# Patient Record
Sex: Female | Born: 1995 | Race: White | Hispanic: No | Marital: Single | Smoking: Never smoker
Health system: Southern US, Community
[De-identification: ages and names within clinical notes are randomized; demographics above are authoritative.]

## PROBLEM LIST (undated history)

## (undated) DIAGNOSIS — M419 Scoliosis, unspecified: Secondary | ICD-10-CM

---

## 2015-03-14 ENCOUNTER — Encounter: Payer: Self-pay | Admitting: Emergency Medicine

## 2015-03-14 ENCOUNTER — Emergency Department: Payer: 59

## 2015-03-14 ENCOUNTER — Emergency Department
Admission: EM | Admit: 2015-03-14 | Discharge: 2015-03-14 | Disposition: A | Discharge status: Home / Self Care | Payer: 59 | Attending: Emergency Medicine | Admitting: Emergency Medicine

## 2015-03-14 DIAGNOSIS — M5412 Radiculopathy, cervical region: Secondary | ICD-10-CM | POA: Insufficient documentation

## 2015-03-14 DIAGNOSIS — S161XXA Strain of muscle, fascia and tendon at neck level, initial encounter: Secondary | ICD-10-CM | POA: Diagnosis not present

## 2015-03-14 DIAGNOSIS — Y998 Other external cause status: Secondary | ICD-10-CM | POA: Diagnosis not present

## 2015-03-14 DIAGNOSIS — X58XXXA Exposure to other specified factors, initial encounter: Secondary | ICD-10-CM | POA: Diagnosis not present

## 2015-03-14 DIAGNOSIS — Y9389 Activity, other specified: Secondary | ICD-10-CM | POA: Diagnosis not present

## 2015-03-14 DIAGNOSIS — Y9289 Other specified places as the place of occurrence of the external cause: Secondary | ICD-10-CM | POA: Diagnosis not present

## 2015-03-14 DIAGNOSIS — S0993XA Unspecified injury of face, initial encounter: Secondary | ICD-10-CM | POA: Insufficient documentation

## 2015-03-14 DIAGNOSIS — M542 Cervicalgia: Secondary | ICD-10-CM | POA: Diagnosis present

## 2015-03-14 HISTORY — DX: Scoliosis, unspecified: M41.9

## 2015-03-14 MED ORDER — PREDNISONE 20 MG PO TABS
40.0000 mg | ORAL_TABLET | Freq: Once | ORAL | Status: AC
  Administered 2015-03-14: 40 mg via ORAL
  Filled 2015-03-14: qty 2

## 2015-03-14 MED ORDER — PREDNISONE 10 MG PO TABS: 10.0000 mg | ORAL_TABLET | Freq: Two times a day (BID) | ORAL | Status: AC

## 2015-03-14 NOTE — ED Notes (Signed)
C/o left sided facial pain, states she has had pain since 10/1, states she has been to a dentist and oral surgery and had xrays and told nothing was wrong with her teeth, states she went to student health at [facility] and they placed her on amoxicillin and she stopped taking it after 2 days then she developed strep throat and started back on amoxicillin, states however she continues to have left sided facial pain, no swelling noted to left side of face

## 2015-03-14 NOTE — Discharge Instructions (Signed)
Cervical Radiculopathy Cervical radiculopathy happens when a nerve in the neck (cervical nerve) is pinched or bruised. This condition can develop because of an injury or as part of the normal aging process. Pressure on the cervical nerves can cause pain or numbness that runs from the neck all the way down into the arm and fingers. Usually, this condition gets better with rest. Treatment may be needed if the condition does not improve.  CAUSES This condition may be caused by:  Injury.  Slipped (herniated) disk.  Muscle tightness in the neck because of overuse.  Arthritis.  Breakdown or degeneration in the bones and joints of the spine (spondylosis) due to aging.  Bone spurs that may develop near the cervical nerves. SYMPTOMS Symptoms of this condition include:  Pain that runs from the neck to the arm and hand. The pain can be severe or irritating. It may be worse when the neck is moved.  Numbness or weakness in the affected arm and hand. DIAGNOSIS This condition may be diagnosed based on symptoms, medical history, and a physical exam. You may also have tests, including:  X-rays.  CT scan.  MRI.  Electromyogram (EMG).  Nerve conduction tests. TREATMENT In many cases, treatment is not needed for this condition. With rest, the condition usually gets better over time. If treatment is needed, options may include:  Wearing a soft neck collar for short periods of time.  Physical therapy to strengthen your neck muscles.  Medicines, such as NSAIDs, oral corticosteroids, or spinal injections.  Surgery. This may be needed if other treatments do not help. Various types of surgery may be done depending on the cause of your problems. HOME CARE INSTRUCTIONS Managing Pain  Take over-the-counter and prescription medicines only as told by your health care provider.  If directed, apply ice to the affected area.  Put ice in a plastic bag.  Place a towel between your skin and the  bag.  Leave the ice on for 20 minutes, 2-3 times per day.  If ice does not help, you can try using heat. Take a warm shower or warm bath, or use a heat pack as told by your health care provider.  Try a gentle neck and shoulder massage to help relieve symptoms. Activity  Rest as needed. Follow instructions from your health care provider about any restrictions on activities.  Do stretching and strengthening exercises as told by your health care provider or physical therapist. General Instructions  If you were given a soft collar, wear it as told by your health care provider.  Use a flat pillow when you sleep.  Keep all follow-up visits as told by your health care provider. This is important. SEEK MEDICAL CARE IF:  Your condition does not improve with treatment. SEEK IMMEDIATE MEDICAL CARE IF:  Your pain gets much worse and cannot be controlled with medicines.  You have weakness or numbness in your hand, arm, face, or leg.  You have a high fever.  You have a stiff, rigid neck.  You lose control of your bowels or your bladder (have incontinence).  You have trouble with walking, balance, or speaking.   This information is not intended to replace advice given to you by your health care provider. Make sure you discuss any questions you have with your health care provider.   Document Released: 02/12/2001 Document Revised: 02/08/2015 Document Reviewed: 07/14/2014 Elsevier Interactive Patient Education [employer]  Your exam and CT scan support the diagnosis of an acute cervical radiculopathy.  Your symptoms should resolve with minimal intervention. Take the steroid as directed.  Continue dosing your muscle relaxant as directed. Apply ice and moist heat therapy as discussed.  Gentle stretching may also benefit you.  Follow-up with Dr. Ernest Pine for continued symptoms.

## 2015-03-14 NOTE — ED Provider Notes (Signed)
[facility] Emergency Department Provider Note ____________________________________________  Time seen: 1829  I have reviewed the triage vital signs and the nursing notes.  HISTORY  Chief Complaint  Facial Pain  HPI Rebecca Bray is a 19 y.o. female reports to the ED for evaluation of progressively worsening left neck, shoulder, and upper extremity pain.  She describes initially noting pain to the left jaw, which prompted her to seek treatment with a local dentist. Dental evaluation and panoramic x-rays did not reveal any underlying abscess or cavity. She then self referred to a local oral surgeon, with a similar outcome after repeat panoramic x-rays. She was then placed on a course of amoxicillin for presumed subacute dental abscess. After 2 days on antibiotics she was suggested to discontinue. She subsequently then tested positive for strep tonsillitis. She has since restarted the amoxicillin by the provider at him and health clinic. Since that time she has noted progression of her symptoms localized from the jaw, to the neck and left upper extremity. She describes numbness, and tingling to the left upper extremity for the last day, and neck pain over the last 2 days. She denies any injury, trauma, or prior history. 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 She rates her pain a 5/10 in triage.  Past Medical History  Diagnosis Date  . Scoliosis    There are no active problems to display for this patient.  History reviewed. No pertinent past surgical history.  Current Outpatient Rx  Name  Route  Sig  Dispense  Refill  . predniSONE (DELTASONE) 10 MG tablet   Oral   Take 1 tablet (10 mg total) by mouth 2 (two) times daily with a meal.   10 tablet   0    Allergies Review of patient's allergies indicates no known allergies.  No family history on file.  Social History Social History  Substance Use Topics  .  Smoking status: Never Smoker   . Smokeless tobacco: None  . Alcohol Use: Yes   Review of Systems  Constitutional: Negative for fever. Eyes: Negative for visual changes. ENT: Negative for sore throat. Cardiovascular: Negative for chest pain. Respiratory: Negative for shortness of breath. Gastrointestinal: Negative for abdominal pain, vomiting and diarrhea. Genitourinary: Negative for dysuria. Musculoskeletal: Negative for back pain. Reports left neck pain with LUE referral as above Skin: Negative for rash. Neurological: Negative for headaches, focal weakness or numbness. ____________________________________________  PHYSICAL EXAM:  VITAL SIGNS: ED Triage Vitals  Enc Vitals Group     BP 03/14/15 1802 117/78 mmHg     Pulse Rate 03/14/15 1802 87     Resp 03/14/15 1802 18     Temp 03/14/15 1802 98.2 F (36.8 C)     Temp Source 03/14/15 1802 Oral     SpO2 03/14/15 1802 100 %     Weight 03/14/15 1802 155 lb (70.308 kg)     Height 03/14/15 1802  (1.676 m)     Head Cir --      Peak Flow --      Pain Score 03/14/15 1803 5     Pain Loc --      Pain Edu? --      Excl. in GC? --    Constitutional: Alert and oriented. Well appearing and in no distress. Head: Normocephalic and atraumatic.      Eyes: Conjunctivae are normal. PERRL. Normal extraocular movements      Ears: Canals clear. TMs intact bilaterally.  Nose: No congestion/rhinorrhea.   Mouth/Throat: Mucous membranes are moist.   Neck: Supple. No thyromegaly. Hematological/Lymphatic/Immunological: No cervical lymphadenopathy. Cardiovascular: Normal rate, regular rhythm.  Respiratory: Normal respiratory effort. No wheezes/rales/rhonchi. Gastrointestinal: Soft and nontender. No distention. Musculoskeletal: Patient with normal spinal alignment without midline tenderness, spasm, or deformity. She is tender palpation over the left posterior occipital paraspinal muscles. She is also without tenderness palpation over  the left upper trapezius and supraspinatus region as well. She noted to have full active range of motion of the left upper extremity and no rotator cuff deficit is appreciated. Normal grip strength bilaterally. Nontender with normal range of motion in all other extremities.  Neurologic:  Cranial nerves II through XII grossly intact. Patient with normal intrinsic and opposition testing. Bilateral symmetric DTRs appreciated. Normal gait without ataxia. Normal speech and language. No gross focal neurologic deficits are appreciated. Skin:  Skin is warm, dry and intact. No rash noted. Psychiatric: Mood and affect are normal. Patient exhibits appropriate insight and judgment. ____________________________________________   RADIOLOGY  CT Neck  IMPRESSION: Straightening of the normal cervical lordosis, otherwise unremarkable CT cervical spine.  I, Random Dobrowski, Charlesetta Ivory, personally viewed and evaluated these images (plain radiographs) as part of my medical decision making.  ____________________________________________  PROCEDURES  Prednisone 40 mg PO ____________________________________________  INITIAL IMPRESSION / ASSESSMENT AND PLAN / ED COURSE  Patient with likely acute myofascial strain of the cervical spine, and indication of a acute cervical radicular symptom on the left. CT results are reviewed with the patient at this time. She is provided with information on cervical reticular symptoms, as well as management instructions including stretching, ice, heat, and limited activity. She is discharged home with a prescription for prednisone, and will continue to dose. He described but 0. She is referred to Dr. Ernest Pine for ongoing or persistent problems. ____________________________________________  FINAL CLINICAL IMPRESSION(S) / ED DIAGNOSES  Final diagnoses:  Cervical myofascial strain, initial encounter  Cervical radiculopathy, acute       Lissa Hoard, PA-C 03/14/15  [id number]  Charlesetta Ivory [place], PA-C 03/14/15 2322  Jennye Moccasin, MD 03/20/15 [phone]

## 2017-08-21 ENCOUNTER — Other Ambulatory Visit: Payer: Self-pay | Admitting: Sports Medicine

## 2017-08-21 DIAGNOSIS — M25571 Pain in right ankle and joints of right foot: Secondary | ICD-10-CM

## 2017-08-21 DIAGNOSIS — M25471 Effusion, right ankle: Secondary | ICD-10-CM

## 2017-08-21 DIAGNOSIS — M76821 Posterior tibial tendinitis, right leg: Secondary | ICD-10-CM

## 2017-08-21 DIAGNOSIS — G8929 Other chronic pain: Secondary | ICD-10-CM

## 2017-08-29 ENCOUNTER — Ambulatory Visit
Admission: RE | Admit: 2017-08-29 | Discharge: 2017-08-29 | Disposition: A | Discharge status: Home / Self Care | Payer: 59 | Source: Ambulatory Visit | Attending: Sports Medicine | Admitting: Sports Medicine

## 2017-08-29 DIAGNOSIS — M25471 Effusion, right ankle: Secondary | ICD-10-CM

## 2017-08-29 DIAGNOSIS — M25571 Pain in right ankle and joints of right foot: Secondary | ICD-10-CM | POA: Diagnosis not present

## 2017-08-29 DIAGNOSIS — M76821 Posterior tibial tendinitis, right leg: Secondary | ICD-10-CM | POA: Insufficient documentation

## 2017-08-29 DIAGNOSIS — M659 Synovitis and tenosynovitis, unspecified: Secondary | ICD-10-CM | POA: Diagnosis not present

## 2017-08-29 DIAGNOSIS — G8929 Other chronic pain: Secondary | ICD-10-CM

## 2018-09-22 IMAGING — MR MR ANKLE*R* W/O CM
5 series · 38 of 40 positions shown · non-contrast
Comparison: None.

CLINICAL DATA: Medial right ankle pain.  Swelling for 1 month.

EXAM:
MRI OF THE RIGHT ANKLE WITHOUT CONTRAST
TECHNIQUE: Multiplanar, multisequence MR imaging of the ankle was performed. No
intravenous contrast was administered.

[Series 3: PD fat-sat · axial · 3.0mm · 0.47mm/px · z∈[-95,+27]mm · 7 of 35 slices shown]
[im 1/35]
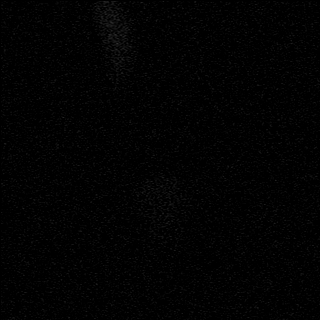
[im 6/35]
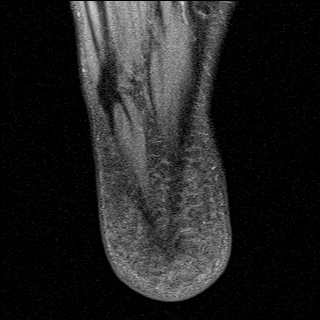
[im 12/35]
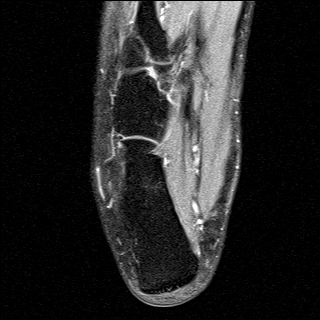
[im 18/35]
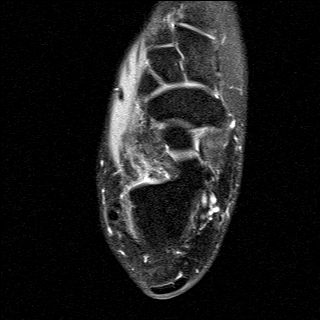
[im 23/35]
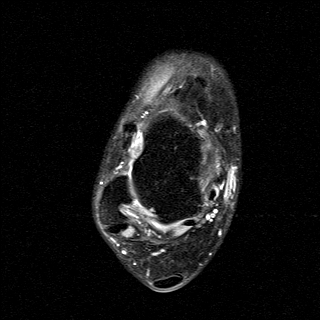
[im 29/35]
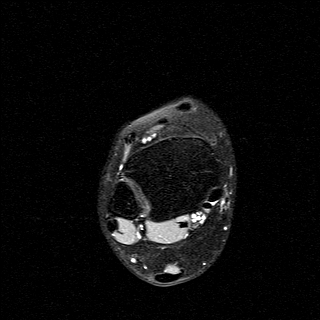
[im 35/35]
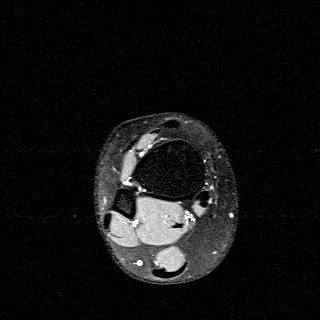

[Series 4: T2 fat-sat · axial · 3.0mm · 0.47mm/px · z∈[-95,+27]mm · 8 of 35 slices shown (1 of 2)]
[im 1/35]
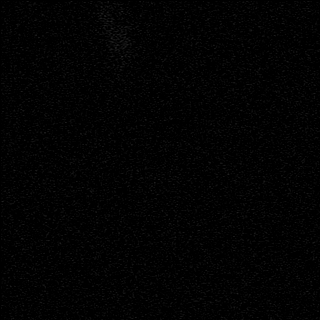
[im 5/35]
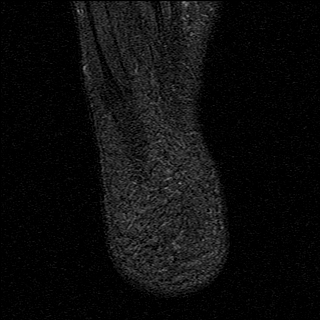
[im 10/35]
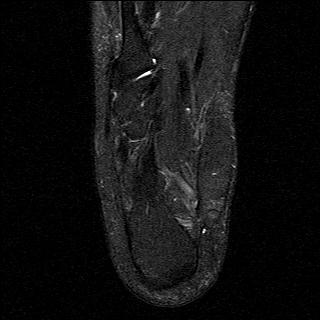
[im 15/35]
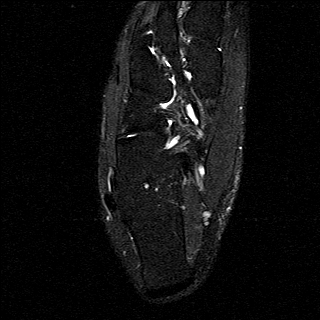
[im 20/35]
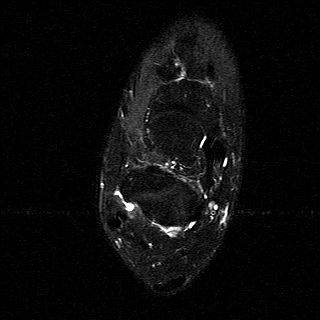
[im 25/35]
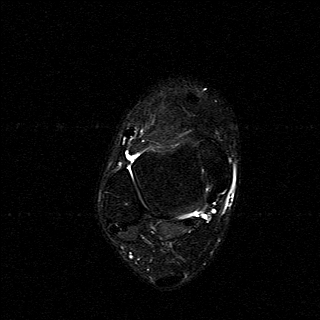
[im 30/35]
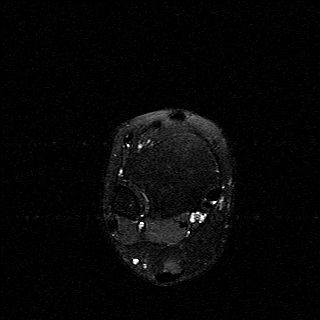
[im 35/35]
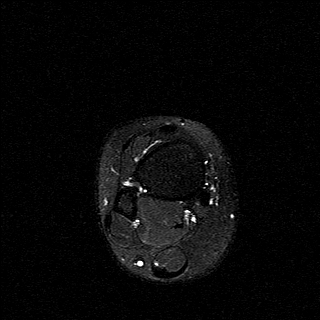

[Series 5: T2 fat-sat · coronal · 3.0mm · 0.56mm/px · 11 of 45 slices shown (2 of 2)]
[im 1/45]
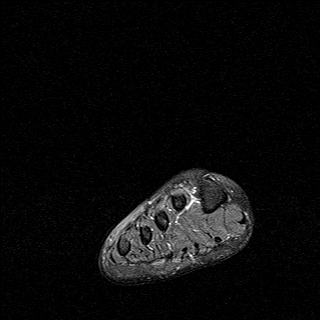
[im 5/45]
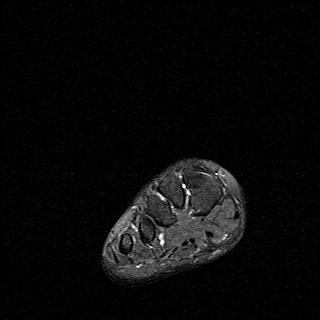
[im 9/45]
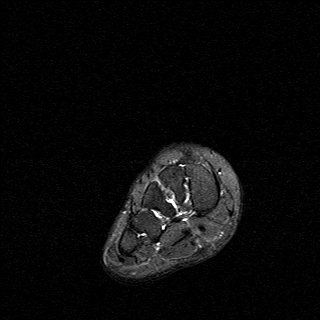
[im 14/45]
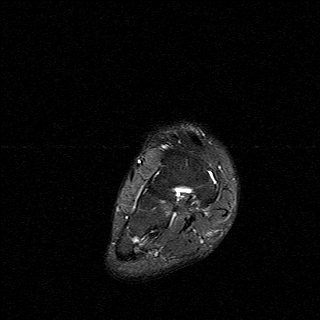
[im 18/45]
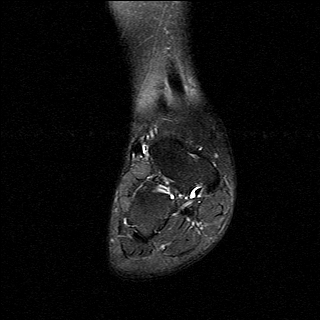
[im 23/45]
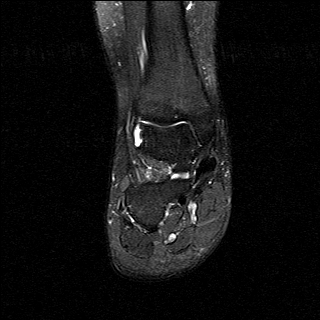
[im 27/45]
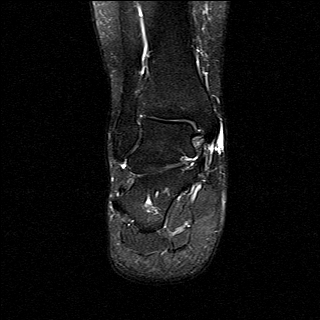
[im 31/45]
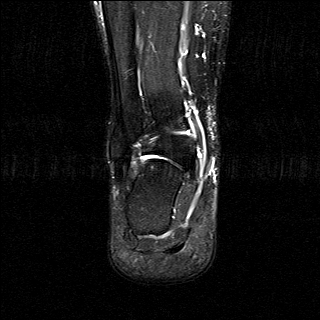
[im 36/45]
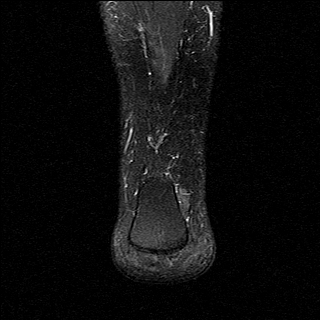
[im 40/45]
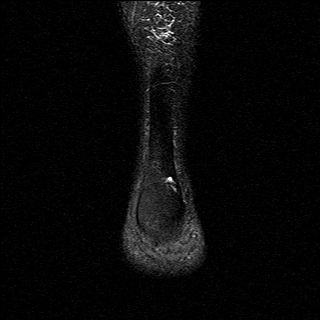
[im 45/45]
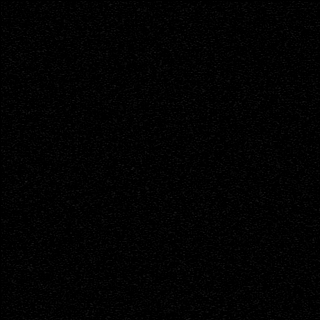

[Series 6: T1 · sagittal · 3.0mm · 0.50mm/px · 7 of 28 slices shown]
[im 1/28]
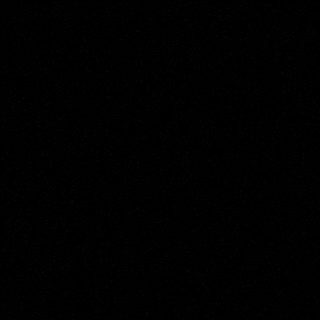
[im 5/28]
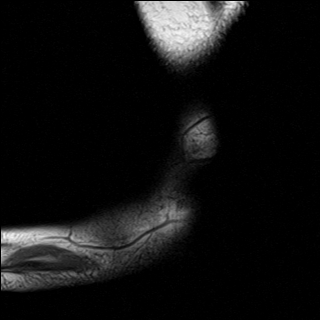
[im 10/28]
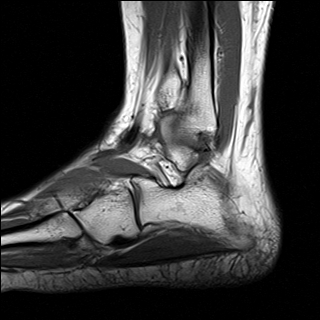
[im 14/28]
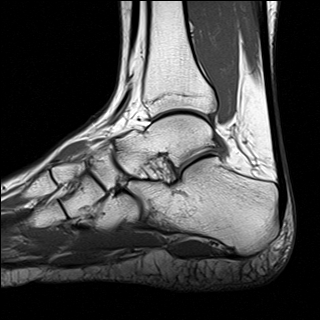
[im 19/28]
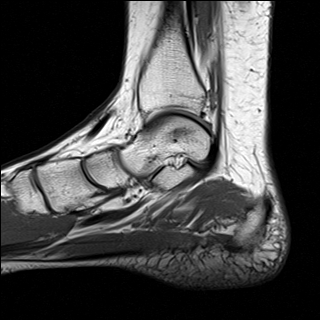
[im 23/28]
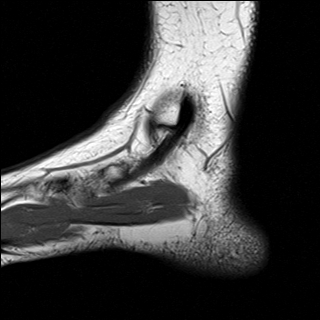
[im 28/28]
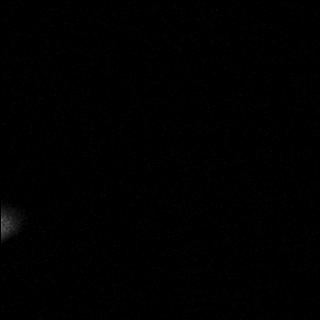

[Series 7: STIR · sagittal · 3.0mm · 0.31mm/px · 5 of 28 slices shown]
[im 1/28]
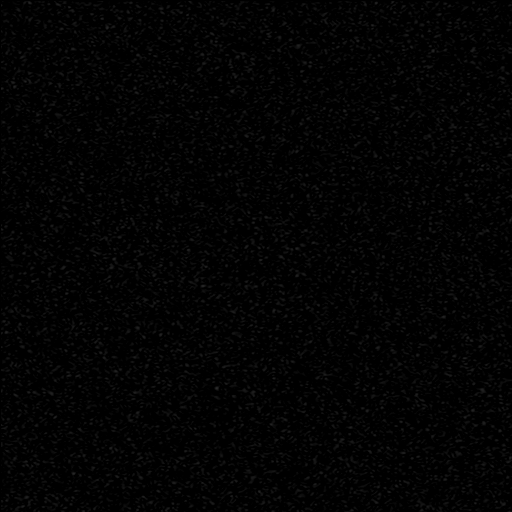
[im 5/28]
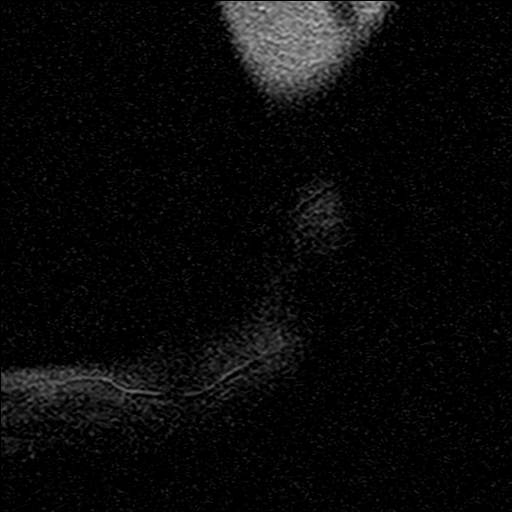
[im 10/28]
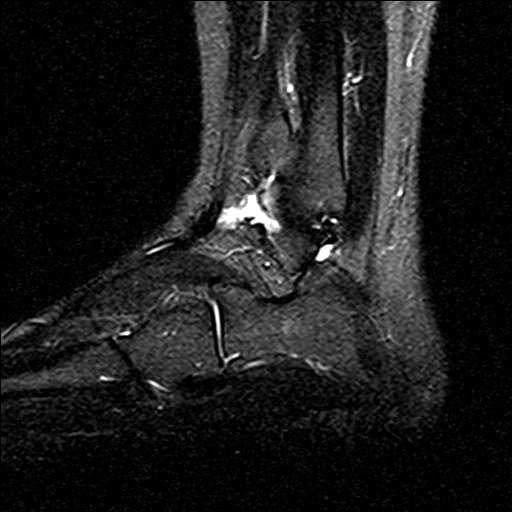
[im 14/28]
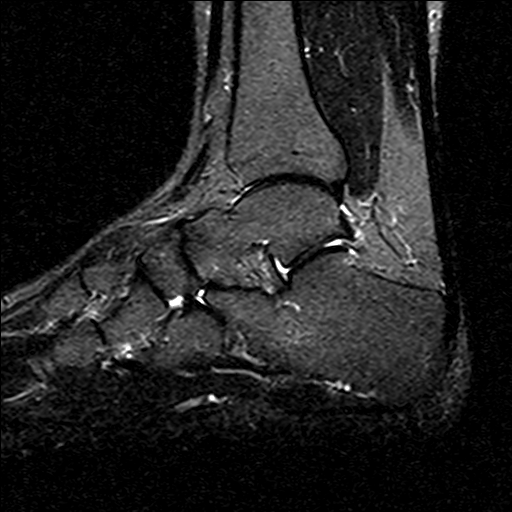
[im 19/28]
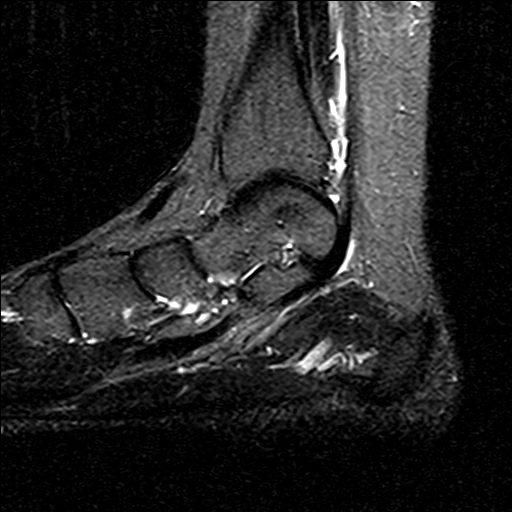

[38 of 40 positions shown; findings below may reference images not displayed]

FINDINGS: TENDONS

Peroneal: Peroneal longus tendon intact. Peroneal brevis intact.

Posteromedial: Posterior tibial tendon intact. Flexor hallucis
longus tendon intact. Flexor digitorum longus tendon intact. Small
amount of fluid in the posterior tibial tendon sheath.

Anterior: Tibialis anterior tendon intact. Extensor hallucis longus
tendon intact Extensor digitorum longus tendon intact.

Achilles:  Intact.

Plantar Fascia: Intact.

LIGAMENTS

Lateral: Anterior talofibular ligament intact. Calcaneofibular
ligament intact. Posterior talofibular ligament intact. Anterior and
posterior tibiofibular ligaments intact.

Medial: Deltoid ligament intact. Spring ligament intact.

CARTILAGE

Ankle Joint: No joint effusion. Normal ankle mortise. No chondral
defect.

Subtalar Joints/Sinus Tarsi: Normal subtalar joints. No subtalar
joint effusion. Normal sinus tarsi.

Bones: No marrow signal abnormality.  No fracture or dislocation.

Soft Tissue: No fluid collection or hematoma. Soft tissues are
normal.
IMPRESSION: 1. Mild posterior tibial tenosynovitis.
